# Patient Record
Sex: Female | Born: 2000 | Race: Black or African American | Hispanic: No | Marital: Single | Smoking: Never smoker
Health system: Southern US, Community
[De-identification: ages and names within clinical notes are randomized; demographics above are authoritative.]

## PROBLEM LIST (undated history)

## (undated) DIAGNOSIS — Z9109 Other allergy status, other than to drugs and biological substances: Secondary | ICD-10-CM

## (undated) DIAGNOSIS — J0391 Acute recurrent tonsillitis, unspecified: Secondary | ICD-10-CM

## (undated) DIAGNOSIS — D509 Iron deficiency anemia, unspecified: Secondary | ICD-10-CM

## (undated) DIAGNOSIS — L709 Acne, unspecified: Secondary | ICD-10-CM

## (undated) HISTORY — DX: Acne, unspecified: L70.9

## (undated) HISTORY — PX: TONSILLECTOMY: SUR1361

## (undated) HISTORY — PX: WISDOM TOOTH EXTRACTION: SHX21

## (undated) HISTORY — DX: Iron deficiency anemia, unspecified: D50.9

---
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
## 2022-03-25 ENCOUNTER — Other Ambulatory Visit: Payer: Self-pay

## 2022-03-25 ENCOUNTER — Encounter (HOSPITAL_COMMUNITY): Payer: Self-pay | Admitting: Emergency Medicine

## 2022-03-25 ENCOUNTER — Ambulatory Visit (HOSPITAL_COMMUNITY)
Admission: EM | Admit: 2022-03-25 | Discharge: 2022-03-25 | Disposition: A | Discharge status: Home / Self Care | Payer: Medicaid Other | Attending: Family Medicine | Admitting: Family Medicine

## 2022-03-25 DIAGNOSIS — J029 Acute pharyngitis, unspecified: Secondary | ICD-10-CM | POA: Diagnosis not present

## 2022-03-25 DIAGNOSIS — J069 Acute upper respiratory infection, unspecified: Secondary | ICD-10-CM

## 2022-03-25 LAB — POC INFLUENZA A AND B ANTIGEN (URGENT CARE ONLY)
INFLUENZA A ANTIGEN, POC: NEGATIVE
INFLUENZA B ANTIGEN, POC: NEGATIVE

## 2022-03-25 MED ORDER — HYDROCODONE BIT-HOMATROP MBR 5-1.5 MG/5ML PO SOLN: 5.0000 mL | Freq: Four times a day (QID) | ORAL | 0 refills | Status: DC | PRN

## 2022-03-25 NOTE — Discharge Instructions (Addendum)
Be aware, your cough medication may cause drowsiness. Please do not drive, operate heavy machinery or make important decisions while on this medication as it may cloud your judgement.  

## 2022-03-25 NOTE — ED Provider Notes (Signed)
[facility]   [id number] 03/25/22 Arrival Time: 1152  ASSESSMENT & PLAN:  1. Viral URI with cough   2. Sore throat    Results for orders placed or performed during the hospital encounter of 03/25/22  POC Influenza A & B Ag (Urgent Care)  Result Value Ref Range   INFLUENZA A ANTIGEN, POC NEGATIVE NEGATIVE   INFLUENZA B ANTIGEN, POC NEGATIVE NEGATIVE   Discussed typical duration of viral illnesses. Lungs clear. OTC as needed. New Prescriptions   HYDROCODONE BIT-HOMATROPINE (HYCODAN) 5-1.5 MG/5ML SYRUP    Take 5 mLs by mouth every 6 (six) hours as needed for cough.   Work note provided.  Follow-up Information     [facility].   Specialty: Urgent Care Why: If worsening or failing to improve as anticipated. Contact information: [address] [place] [address] [phone]                Reviewed expectations re: course of current medical issues. Questions answered. Outlined signs and symptoms indicating need for more acute intervention. Understanding verbalized. After Visit Summary given.   SUBJECTIVE: History from: Patient. Audrey Jones is a 21 y.o. female. Reports: nasal congestion, ST, cough; abrupt onset; x 3-4 d. Denies: difficulty breathing. Tmax 102F 48 hours ago; none since. Today fainted at work. No head injury. Recovered quickly; remembers event. H/O same when sick. Ambulatory without difficulty. Normal PO intake without n/v/d.  OBJECTIVE:  Vitals:   03/25/22 1628  BP: 112/72  Pulse: 94  Resp: 18  Temp: 98.2 F (36.8 C)  TempSrc: Oral  SpO2: 98%    General appearance: alert; no distress Eyes: PERRLA; EOMI; conjunctiva normal HENT: [place]; AT; with nasal congestion Neck: supple  CV: RRR Lungs: speaks full sentences without difficulty; unlabored; CTAB Extremities: no edema Skin: warm and dry Neurologic: normal gait Psychological: alert and cooperative; normal mood and  affect  Labs: Results for orders placed or performed during the hospital encounter of 03/25/22  POC Influenza A & B Ag (Urgent Care)  Result Value Ref Range   INFLUENZA A ANTIGEN, POC NEGATIVE NEGATIVE   INFLUENZA B ANTIGEN, POC NEGATIVE NEGATIVE   Labs Reviewed  POC INFLUENZA A AND B ANTIGEN (URGENT CARE ONLY)    No Known Allergies  Past Medical History:  Diagnosis Date   Environmental allergies    pollen, trees   Recurrent tonsillitis 10/2016   denies snoring/apnea   Social History   Socioeconomic History   Marital status: Single    Spouse name: Not on file   Number of children: Not on file   Years of education: Not on file   Highest education level: Not on file  Occupational History   Not on file  Tobacco Use   Smoking status: Never   Smokeless tobacco: Never  Vaping Use   Vaping Use: Never used  Substance and Sexual Activity   Alcohol use: No   Drug use: No   Sexual activity: Not on file  Other Topics Concern   Not on file  Social History Narrative   Not on file   Social Determinants of Health   Financial Resource Strain: Not on file  Food Insecurity: Not on file  Transportation Needs: Not on file  Physical Activity: Not on file  Stress: Not on file  Social Connections: Not on file  Intimate Partner Violence: Not on file   History reviewed. No pertinent family history. Past Surgical History:  Procedure Laterality Date  TONSILLECTOMY     TONSILLECTOMY AND ADENOIDECTOMY N/A 11/02/2016   Procedure: TONSILLECTOMY AND ADENOIDECTOMY;  Surgeon: Flo Shanks, MD;  Location: [facility];  Service: ENT;  Laterality: N/A;     Mardella Layman, MD 03/25/22 1726

## 2022-03-25 NOTE — ED Triage Notes (Signed)
Patient reports symptoms started Sunday.  Today felt the worse.  Patient went to work and had a syncopal episode at work.    Sunday had fever reported as 102 and chills.  Symptoms are worse at night.  Complains of cough, intermittently with phlegm.  Patient has had nausea, vomiting and diarrhea.  Patient has not had any vomiting or diarrhea today, poor appetite.  Patient is drinking water.    Patient has tried robitussin.

## 2022-04-28 NOTE — Progress Notes (Unsigned)
Subjective:    Patient ID: Audrey Jones, female    DOB: 2000-07-31, 22 y.o.   MRN: [id number]   CC: New Patient  Poor appetite: Chronic issue due to low appetite and some nausea with eating. Previously on Cyproheptadine that was effective but has been off it for the last year. Denies side effects on the medication. Feels aware of what she needs to eat and declines interest in nutrition support.  Acne: Worsening since 2022, has been taking Tretinoin every other night without much effectiveness. Trialed many difference face washes and creams without success. Interested in dermatology referral for other treatment options. Sister is on Accutane and it has been effective for her.  Anemia: H/o IDA due nutritional deficiency. Seen by Peds Hem that recommended oral supplementation. States she takes supplementation rarely. Has not had blood cell levels checked recently.   HPI: PMHx: Past Medical History:  Diagnosis Date   Acne    Environmental allergies    pollen, trees   IDA (iron deficiency anemia)      Surgical Hx: Past Surgical History:  Procedure Laterality Date   TONSILLECTOMY AND ADENOIDECTOMY N/A 11/02/2016   Procedure: TONSILLECTOMY AND ADENOIDECTOMY;  Surgeon: Jodi Marble, MD;  Location: [place];  Service: ENT;  Laterality: N/A;   WISDOM TOOTH EXTRACTION Bilateral    4 teeth removed     Family Hx: Family History  Problem Relation Age of Onset   Breast cancer Mother        Around age 70   Anemia Sister      Social Hx: Current Social History 04/30/2022  Who lives at home: Family Who would speak for you about health care matters: Mother Transportation: Drives herself Important Relationships & Pets: None Current Stressors: None Work / Education:  [employer] (work), [employer] - Conservation officer, historic buildings) for school Religious / Personal Beliefs: Muslim - female providers preferred   Medications: None regularly   ROS: Skin changes on  face.   Preventative Screening Colonoscopy: N/A Mammogram: N/A Pap test: N/A - under 39 yo DEXA: N/A Tetanus vaccine: UTD  Smoking status reviewed   Objective:  BP 98/65   Pulse 70   Ht 5\' 6"  (1.676 m)   Wt 112 lb (50.8 kg)   LMP 04/19/2022   SpO2 100%   BMI 18.08 kg/m  Vitals and nursing note reviewed  General: well nourished, in no acute distress HEENT: normocephalic, TM's visualized bilaterally, no scleral icterus or conjunctival pallor, no nasal discharge, moist mucous membranes, good dentition without erythema or discharge noted in posterior oropharynx Neck: supple, non-tender, without lymphadenopathy Cardiac: RRR, clear S1 and S2, no murmurs, rubs, or gallops Respiratory: clear to auscultation bilaterally, no increased work of breathing Abdomen: soft, nontender, nondistended, no masses or organomegaly. Bowel sounds present Extremities: no edema or cyanosis. Warm, well perfused. 2+ radial and PT pulses bilaterally Skin: warm and dry, no rashes noted Neuro: alert and oriented, no focal deficits   Assessment & Plan:    Poor appetite Chronic issue related to nausea with eating. Previously on Cyproheptadine 4mg  that was effective for weight gain, denies side effects. Would like to try the medication again, Cyproheptadine 4mg  precsribed with advisement she can start with 2mg  and increase to 4mg  if ineffective.  Acne Worsening since 2022, Tretinion and OTC topicals and face washes ineffective. Refer to dermatology for potential Accutane. Recommended scheduling with [facility] derm clinic in the mean time to discuss other options.  IDA (iron deficiency anemia) H/o IDA  secondary to nutritional deficiency. Rarely uses iron supplement but has not had lab work in a while. Occasional dizziness upon standing. CBC, iron panel today.  Declined flu shot.  Colletta Maryland, DO

## 2022-04-30 ENCOUNTER — Ambulatory Visit (INDEPENDENT_AMBULATORY_CARE_PROVIDER_SITE_OTHER): Payer: Medicaid Other | Admitting: Family Medicine

## 2022-04-30 ENCOUNTER — Encounter: Payer: Self-pay | Admitting: Family Medicine

## 2022-04-30 VITALS — BP 98/65 | HR 70 | Ht 66.0 in | Wt 112.0 lb

## 2022-04-30 DIAGNOSIS — Z1159 Encounter for screening for other viral diseases: Secondary | ICD-10-CM | POA: Diagnosis not present

## 2022-04-30 DIAGNOSIS — L700 Acne vulgaris: Secondary | ICD-10-CM

## 2022-04-30 DIAGNOSIS — D509 Iron deficiency anemia, unspecified: Secondary | ICD-10-CM | POA: Diagnosis not present

## 2022-04-30 DIAGNOSIS — L709 Acne, unspecified: Secondary | ICD-10-CM | POA: Insufficient documentation

## 2022-04-30 DIAGNOSIS — Z114 Encounter for screening for human immunodeficiency virus [HIV]: Secondary | ICD-10-CM

## 2022-04-30 DIAGNOSIS — R630 Anorexia: Secondary | ICD-10-CM

## 2022-04-30 MED ORDER — CYPROHEPTADINE HCL 4 MG PO TABS: 4.0000 mg | ORAL_TABLET | Freq: Every day | ORAL | 2 refills | Status: DC

## 2022-04-30 NOTE — Assessment & Plan Note (Signed)
H/o IDA secondary to nutritional deficiency. Rarely uses iron supplement but has not had lab work in a while. Occasional dizziness upon standing. CBC, iron panel today.

## 2022-04-30 NOTE — Assessment & Plan Note (Signed)
Worsening since 2022, Tretinion and OTC topicals and face washes ineffective. Refer to dermatology for potential Accutane. Recommended scheduling with [facility] derm clinic in the mean time to discuss other options.

## 2022-04-30 NOTE — Patient Instructions (Signed)
It was wonderful to see you today! Thank you for choosing [facility].   Please bring ALL of your medications with you to every visit.   Today we talked about:  I referred you to dermatology for your acne. In the meantime I recommend following up with our derm clinic to discuss other options. We checked lab work for anemia today, I will follow up with you about those results. I sent in a prescription for the Cyproheptadine to help with weight gain. If you have any concerns about it please let me know. You can always start with half a pill daily and then take a full tablet if it is ineffective.  Please follow up with the dermatology clinic at your convenience.  If you haven't already, sign up for My Chart to have easy access to your labs results, and communication with your primary care physician.   We are checking some labs today. If they are abnormal, I will call you. If they are normal, I will send you a MyChart message (if it is active) or a letter in the mail. If you do not hear about your labs in the next 2 weeks, please call the office.  Call the clinic at [phone] if your symptoms worsen or you have any concerns.  Please be sure to schedule follow up at the front desk before you leave today.   Colletta Maryland, DO Family Medicine

## 2022-04-30 NOTE — Assessment & Plan Note (Addendum)
Chronic issue related to nausea with eating. Previously on Cyproheptadine 4mg  that was effective for weight gain, denies side effects. Would like to try the medication again, Cyproheptadine 4mg  precsribed with advisement she can start with 2mg  and increase to 4mg  if ineffective.

## 2022-05-01 ENCOUNTER — Telehealth: Payer: Self-pay | Admitting: Family Medicine

## 2022-05-01 DIAGNOSIS — D509 Iron deficiency anemia, unspecified: Secondary | ICD-10-CM

## 2022-05-01 LAB — IRON,TIBC AND FERRITIN PANEL
Ferritin: 10 ng/mL — ABNORMAL LOW (ref 15–77)
Iron Saturation: 18 % (ref 15–55)
Iron: 70 ug/dL (ref 27–159)
Total Iron Binding Capacity: 397 ug/dL (ref 250–450)
UIBC: 327 ug/dL (ref 131–425)

## 2022-05-01 LAB — CBC
Hematocrit: 34 % (ref 34.0–46.6)
Hemoglobin: 11 g/dL — ABNORMAL LOW (ref 11.1–15.9)
MCH: 26.3 pg — ABNORMAL LOW (ref 26.6–33.0)
MCHC: 32.4 g/dL (ref 31.5–35.7)
MCV: 81 fL (ref 79–97)
Platelets: 217 10*3/uL (ref 150–450)
RBC: 4.19 x10E6/uL (ref 3.77–5.28)
RDW: 13.7 % (ref 11.7–15.4)
WBC: 3.4 10*3/uL (ref 3.4–10.8)

## 2022-05-01 LAB — HIV ANTIBODY (ROUTINE TESTING W REFLEX): HIV Screen 4th Generation wRfx: NONREACTIVE

## 2022-05-01 LAB — HEPATITIS C ANTIBODY: Hep C Virus Ab: NONREACTIVE

## 2022-05-01 MED ORDER — FERROUS SULFATE 325 (65 FE) MG PO TABS: 325.0000 mg | ORAL_TABLET | ORAL | 1 refills | Status: AC

## 2022-05-01 NOTE — Telephone Encounter (Signed)
Spoke with patient about Hgb 11, mildly low ferritin on recent lab work. She preferred to start taking iron supplementation every other day and recheck level in about 3 months. Prescription sent for MWF ferrous sulfate.  Colletta Maryland, DO

## 2022-05-07 ENCOUNTER — Ambulatory Visit (INDEPENDENT_AMBULATORY_CARE_PROVIDER_SITE_OTHER): Payer: Medicaid Other | Admitting: Student

## 2022-05-07 VITALS — BP 99/70 | HR 70 | Ht 66.0 in | Wt 119.2 lb

## 2022-05-07 DIAGNOSIS — L700 Acne vulgaris: Secondary | ICD-10-CM | POA: Diagnosis not present

## 2022-05-07 MED ORDER — CLINDAMYCIN PHOSPHATE 1 % EX SWAB: 1.0000 "application " | Freq: Two times a day (BID) | CUTANEOUS | 0 refills | Status: AC

## 2022-05-07 NOTE — Patient Instructions (Signed)
It was great to see you today!   Today we addressed: Your acne, we prescribed topical clindamycin for you to use daily with your regular skin care routine.  Follow up with your dermatologist in March   You should return to our clinic Return in about 6 months (around 11/05/2022).  Please arrive 15 minutes before your appointment to ensure smooth check in process.    Please call the clinic at [phone] if your symptoms worsen or you have any concerns.  Thank you for allowing me to participate in your care, Dr. Darci Current [facility]

## 2022-05-07 NOTE — Assessment & Plan Note (Addendum)
-  prescribed topical clindamycin to be used daily with the tretinoin.  - consider adding additional benzoyl peroxide OTC with the tretinoin as she is able to tolerate  - follow up with dermatology in March

## 2022-05-07 NOTE — Progress Notes (Signed)
    SUBJECTIVE:   CHIEF COMPLAINT / HPI:   Audrey Jones is a 22 y.o. female  presenting for follow up for acne. She has been using topical tretinoin since 05/2021. She has tried using topical benzoyl peroxide which dried her face out too much. She has tried using topical clindamycin in the past without significant improvement, but did not use it consistently.   She has a referral to dermatology clinic scheduled in March. She is hesitant about going on birth control or using tretinoin due to the side effects.   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  - consider adding additional benzoyl peroxide OTC with the tretinoin as she is able to tolerate  - follow up with dermatology in March    Darci Current, [place]

## 2022-08-27 DIAGNOSIS — F319 Bipolar disorder, unspecified: Secondary | ICD-10-CM | POA: Diagnosis not present

## 2022-09-14 DIAGNOSIS — F900 Attention-deficit hyperactivity disorder, predominantly inattentive type: Secondary | ICD-10-CM | POA: Diagnosis not present

## 2022-09-14 DIAGNOSIS — F319 Bipolar disorder, unspecified: Secondary | ICD-10-CM | POA: Diagnosis not present

## 2022-09-14 DIAGNOSIS — F411 Generalized anxiety disorder: Secondary | ICD-10-CM | POA: Diagnosis not present

## 2023-01-22 DIAGNOSIS — L700 Acne vulgaris: Secondary | ICD-10-CM | POA: Diagnosis not present

## 2023-04-23 ENCOUNTER — Other Ambulatory Visit: Payer: Self-pay | Admitting: Family Medicine

## 2023-04-23 DIAGNOSIS — R630 Anorexia: Secondary | ICD-10-CM

## 2023-06-29 NOTE — Progress Notes (Deleted)
    SUBJECTIVE:   CHIEF COMPLAINT / HPI: Back pain  Discussed the use of AI scribe software for clinical note transcription with the patient, who gave verbal consent to proceed.  History of Present Illness    PERTINENT  PMH / PSH: ***  OBJECTIVE:   There were no vitals taken for this visit.  ***  ASSESSMENT/PLAN:   Assessment & Plan    Assessment and Plan Assessment & Plan    Genora Kidd, MD [facility]

## 2023-06-30 ENCOUNTER — Ambulatory Visit: Admitting: Student

## 2023-07-20 ENCOUNTER — Ambulatory Visit: Admitting: Student

## 2023-07-20 VITALS — BP 96/63 | HR 65 | Ht 66.0 in | Wt 123.6 lb

## 2023-07-20 DIAGNOSIS — M62838 Other muscle spasm: Secondary | ICD-10-CM | POA: Diagnosis not present

## 2023-07-20 MED ORDER — CYCLOBENZAPRINE HCL 10 MG PO TABS: 10.0000 mg | ORAL_TABLET | Freq: Three times a day (TID) | ORAL | 0 refills | Status: DC | PRN

## 2023-07-20 NOTE — Assessment & Plan Note (Addendum)
 Acute neck pain with limited range of motion due to muscle spasm, likely from strain or tension, patient noted sleeping funny and waking up with pop/crick in her neck. Partial relief with NSAIDs. Muscle relaxers recommended for sedative effects aiding in muscle relaxation and pain relief. - Flexeril TID PRN (precautions provided) - Continue Aleve for pain management. - Recommend light stretching and gentle massage.  - Suggest lidocaine patches for additional pain relief.  - Consider physical therapy referral if symptoms persist to next week

## 2023-07-20 NOTE — Patient Instructions (Signed)
 It was great to see you! Thank you for allowing me to participate in your care!  I recommend that you always bring your medications to each appointment as this makes it easy to ensure we are on the correct medications and helps us  not miss when refills are needed.  Our plans for today:  - Muscle Spasm You have a muscle spasm in your neck. This should resolve with the muscle relaxer, but follow up next week if symptoms not better.   Can take Flexeril every 8 hours as needed, but suggest you start at night, before sleep  (Be sure not to dive or do important task while taking!)   If not better by Thursday can consider getting massage or return to clinic and we will send you to Physical Therapy.   Take care and seek immediate care sooner if you develop any concerns.   Dr. Wilhemena Harbour, MD [facility]

## 2023-07-20 NOTE — Progress Notes (Signed)
  SUBJECTIVE:   CHIEF COMPLAINT / HPI:   Neck Spasm They have been experiencing neck pain since Sunday morning after sitting up in bed and hearing a loud 'pop' sound, which they describe as similar to a muscle spasm. Initially, they were unable to sit up straight, but now they can, although they experience pain when leaning their head to the left. The pain is generalized with sharp shooting sensations when turning their head, but they cannot pinpoint the exact location of the pain.   They have tried various treatments including Vicks VapoRub, a muscle rub from the pharmacy, Aleve, and ibuprofen. These treatments have helped manage the pain but have not resolved it. Aleve provides better relief than ibuprofen. They are currently taking Aleve as needed, following the recommended dosing schedule.   They take cyproheptadine for hay fever, which makes them sleepy, and melatonin at bedtime. They are concerned about potential interactions with their current medications. No other symptoms beyond the neck pain and associated limitations in movement. They plan to return to work tomorrow and have a trip planned to the beach on Friday.     PERTINENT  PMH / PSH:   OBJECTIVE:  BP 96/63   Pulse 65   Ht 5\' 6"  (1.676 m)   Wt 123 lb 9.6 oz (56.1 kg)   LMP 06/19/2023   SpO2 100%   BMI 19.95 kg/m  Physical Exam Musculoskeletal:     Cervical back: Rigidity present. No erythema, signs of trauma, torticollis or crepitus. Pain with movement and muscular tenderness present. Decreased range of motion.      ASSESSMENT/PLAN:   Assessment & Plan Neck muscle spasm Acute neck pain with limited range of motion due to muscle spasm, likely from strain or tension, patient noted sleeping funny and waking up with pop/crick in her neck. Partial relief with NSAIDs. Muscle relaxers recommended for sedative effects aiding in muscle relaxation and pain relief. - Flexeril TID PRN (precautions provided) - Continue Aleve  for pain management. - Recommend light stretching and gentle massage.  - Suggest lidocaine patches for additional pain relief.  - Consider physical therapy referral if symptoms persist to next week  No follow-ups on file. Wilhemena Harbour, MD 07/20/2023, 1:39 PM PGY-3, [facility]

## 2024-02-09 ENCOUNTER — Other Ambulatory Visit: Payer: Self-pay | Admitting: Family Medicine

## 2024-02-09 DIAGNOSIS — R630 Anorexia: Secondary | ICD-10-CM

## 2024-02-10 DIAGNOSIS — F331 Major depressive disorder, recurrent, moderate: Secondary | ICD-10-CM | POA: Diagnosis not present

## 2024-02-10 DIAGNOSIS — F4310 Post-traumatic stress disorder, unspecified: Secondary | ICD-10-CM | POA: Diagnosis not present

## 2024-03-06 DIAGNOSIS — F4310 Post-traumatic stress disorder, unspecified: Secondary | ICD-10-CM | POA: Diagnosis not present

## 2024-03-06 DIAGNOSIS — F331 Major depressive disorder, recurrent, moderate: Secondary | ICD-10-CM | POA: Diagnosis not present

## 2024-04-04 ENCOUNTER — Ambulatory Visit (INDEPENDENT_AMBULATORY_CARE_PROVIDER_SITE_OTHER): Payer: Self-pay | Admitting: Family Medicine

## 2024-04-04 VITALS — BP 96/67 | HR 98 | Temp 98.3°F | Ht 66.0 in | Wt 112.2 lb

## 2024-04-04 DIAGNOSIS — R42 Dizziness and giddiness: Secondary | ICD-10-CM | POA: Diagnosis not present

## 2024-04-04 DIAGNOSIS — Z23 Encounter for immunization: Secondary | ICD-10-CM

## 2024-04-04 DIAGNOSIS — I951 Orthostatic hypotension: Secondary | ICD-10-CM | POA: Diagnosis not present

## 2024-04-04 NOTE — Progress Notes (Signed)
 "   SUBJECTIVE:   CHIEF COMPLAINT / HPI:   Dizziness This has been a chronic problem for the patient; as a child she was often found to be iron deficient, and anemia was thought to be the cause of her dizziness and syncopal episode.  She has previously received IV iron infusions and continues to take oral iron supplements.  Over the last several months she feels like the dizziness has gotten worse. Has had to leave work due to dizziness, fatigue. Works at [employer].  She notes symptoms are worst when getting up from lying down: This always causes dizziness. Sometimes vision goes black and she passes out - last episode in July. Has had episodes where she has hit her head. This most recent episode a coworker said she was very stiff, eyes open.  Possible shaking during this episode.  Also of note: She started Wellbutrin 12/3, lost 10 lbs due to suppressed appetite, and d/c'd yesterday.  No dietary restrictions, primarily eats chicken. Also eats beans, good variety fruits, vegetables. Well-hydrated. Typically does not drink caffeine, but occasional single cup of coffee at work x2 week.  She is about to leave town to move to [place], starting school at [employer] this [employer].  She is very concerned about being away from home given her symptoms.  She is curious about POTS.  PERTINENT  PMH / PSH: Reviewed. Poor appetite with IDA, MDD/anxiety, ADHD  History of presyncope and heart murmur proven benign on echo/EKG by Dr. Rosan, [facility] [phone]). At that time noted: Her presentation is consistent with vasovagal near syncope and syncope which is exacerbated by a relative dehydration based on her history of limited clear liquid intake and skipping meals often.  OBJECTIVE:   BP 96/67   Pulse 98   Temp 98.3 F (36.8 C) (Oral)   Ht 5' 6 (1.676 m)   Wt 112 lb 4 oz (50.9 kg)   LMP 03/22/2024   SpO2 98%   BMI 18.12 kg/m   Orthostatic vitals today do show HR  increase >30 bpm when transitioning from lying to sitting and sitting to standing.  General: well-appearing, very skinny, no acute distress. HEENT: normocephalic, PERRLA, EOM grossly intact, MMM, bilateral TM visualized without erythema or bulging. Cardio: Regular rate, regular rhythm, no murmurs on exam. Pulm: Clear, no wheezing, no crackles. No increased work of breathing. Extremities: No peripheral edema. Moves all extremities equally. Neuro: CN II: PERRL CN III, IV,VI: EOMI CVII: Symmetric smile and brow raise CN VIII: Normal hearing CN XI: 5/5 shoulder shrug UE and LE strength 5/5 Normal sensation in UE and LE bilaterally  Normal gait. Psych:  Cognition and judgment appear intact. Alert, communicative, and cooperative.   ASSESSMENT/PLAN:   Assessment & Plan Dizziness Chronic, unclear etiology. Considering seizure-related disorder given possible shaking episode with syncope. Also consider thyroid  dysfunction, electrolyte imbalance, anemia of various etiology, adrenal dysfunction, POTS. -Counseled patient that she will most likely require more workup to obtain answers; answered all questions to the best my ability and discussed workup options. - Referral placed to neurology for evaluation for possible seizure activity - Patient will return for lab visit to include TSH, CBC, BMP, AM cortisol -Supportive care instructions given including moving slowly when sitting up or standing, avoiding activities or scenarios known to cause/worsen dizziness, maintaining good hydration and nutrition. - Note provided for work requesting accommodations to include breaks as needed. - Note provided for University to make them aware of patient currently undergoing further  evaluation. - Advised patient to consider seeking primary care provider in the [facility] area in case this is needed while she is away from home; strongly encouraged her to make contact with the health center at her [place] in case  they have resources or supports they may offer.    Per patient request meningococcal B vaccine administered today, vaccine records printed and provided to patient.  Lauraine Norse, DO [facility] "

## 2024-04-04 NOTE — Patient Instructions (Signed)
 I have referred you to Neurology - they will call you to make an appointment.  Stay well-hydrated. Do your best to avoid any activities which are known triggers of dizziness (I know this is tough!). Sit and stand slowly.  Please make a lab appointment one morning this week to come back for an additional blood test.

## 2024-04-05 ENCOUNTER — Other Ambulatory Visit

## 2024-04-05 DIAGNOSIS — R42 Dizziness and giddiness: Secondary | ICD-10-CM

## 2024-04-05 DIAGNOSIS — I951 Orthostatic hypotension: Secondary | ICD-10-CM

## 2024-04-06 ENCOUNTER — Ambulatory Visit: Payer: Self-pay | Admitting: Family Medicine

## 2024-04-06 LAB — BASIC METABOLIC PANEL WITH GFR
BUN/Creatinine Ratio: 19 (ref 9–23)
BUN: 13 mg/dL (ref 6–20)
CO2: 23 mmol/L (ref 20–29)
Calcium: 9.1 mg/dL (ref 8.7–10.2)
Chloride: 105 mmol/L (ref 96–106)
Creatinine, Ser: 0.7 mg/dL (ref 0.57–1.00)
Glucose: 92 mg/dL (ref 70–99)
Potassium: 4.4 mmol/L (ref 3.5–5.2)
Sodium: 142 mmol/L (ref 134–144)
eGFR: 126 mL/min/1.73

## 2024-04-06 LAB — CBC WITH DIFFERENTIAL/PLATELET
Basophils Absolute: 0 x10E3/uL (ref 0.0–0.2)
Basos: 0 %
EOS (ABSOLUTE): 0.1 x10E3/uL (ref 0.0–0.4)
Eos: 2 %
Hematocrit: 43.2 % (ref 34.0–46.6)
Hemoglobin: 14.1 g/dL (ref 11.1–15.9)
Immature Grans (Abs): 0 x10E3/uL (ref 0.0–0.1)
Immature Granulocytes: 0 %
Lymphocytes Absolute: 1.3 x10E3/uL (ref 0.7–3.1)
Lymphs: 20 %
MCH: 29.3 pg (ref 26.6–33.0)
MCHC: 32.6 g/dL (ref 31.5–35.7)
MCV: 90 fL (ref 79–97)
Monocytes Absolute: 0.7 x10E3/uL (ref 0.1–0.9)
Monocytes: 12 %
Neutrophils Absolute: 4.2 x10E3/uL (ref 1.4–7.0)
Neutrophils: 66 %
Platelets: 212 x10E3/uL (ref 150–450)
RBC: 4.81 x10E6/uL (ref 3.77–5.28)
RDW: 12.6 % (ref 11.7–15.4)
WBC: 6.3 x10E3/uL (ref 3.4–10.8)

## 2024-04-06 LAB — CORTISOL-AM, BLOOD: Cortisol - AM: 17.1 ug/dL (ref 6.2–19.4)

## 2024-04-06 LAB — TSH: TSH: 1.09 u[IU]/mL (ref 0.450–4.500)

## 2024-06-06 ENCOUNTER — Ambulatory Visit: Payer: Self-pay | Admitting: Neurology
